# Patient Record
Sex: Male | Born: 1985
Health system: Southern US, Community
[De-identification: ages and names within clinical notes are randomized; demographics above are authoritative.]

## PROBLEM LIST (undated history)

## (undated) ENCOUNTER — Emergency Department (HOSPITAL_COMMUNITY): Admission: EM | Payer: BLUE CROSS/BLUE SHIELD | Source: Home / Self Care

## (undated) DIAGNOSIS — I10 Essential (primary) hypertension: Secondary | ICD-10-CM

## (undated) DIAGNOSIS — A150 Tuberculosis of lung: Secondary | ICD-10-CM

## (undated) HISTORY — DX: Essential (primary) hypertension: I10

## (undated) HISTORY — DX: Tuberculosis of lung: A15.0

## (undated) HISTORY — PX: OTHER SURGICAL HISTORY: SHX169

## (undated) HISTORY — PX: KNEE SURGERY: SHX244

---
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
## 2009-03-19 ENCOUNTER — Emergency Department (HOSPITAL_COMMUNITY): Admission: EM | Admit: 2009-03-19 | Discharge: 2009-03-19 | Payer: Self-pay | Admitting: Emergency Medicine

## 2010-05-02 ENCOUNTER — Encounter: Payer: Self-pay | Admitting: Orthopaedic Surgery

## 2017-11-22 DIAGNOSIS — Z7251 High risk heterosexual behavior: Secondary | ICD-10-CM | POA: Diagnosis not present

## 2017-11-22 DIAGNOSIS — Z7721 Contact with and (suspected) exposure to potentially hazardous body fluids: Secondary | ICD-10-CM | POA: Diagnosis not present

## 2018-06-22 ENCOUNTER — Other Ambulatory Visit: Payer: Self-pay

## 2018-06-22 ENCOUNTER — Encounter: Payer: Self-pay | Admitting: Family Medicine

## 2018-06-22 ENCOUNTER — Ambulatory Visit (INDEPENDENT_AMBULATORY_CARE_PROVIDER_SITE_OTHER): Payer: BLUE CROSS/BLUE SHIELD | Admitting: Family Medicine

## 2018-06-22 VITALS — BP 150/84 | HR 91 | Temp 98.0°F | Resp 14 | Wt 251.0 lb

## 2018-06-22 DIAGNOSIS — Z0000 Encounter for general adult medical examination without abnormal findings: Secondary | ICD-10-CM

## 2018-06-22 DIAGNOSIS — K4090 Unilateral inguinal hernia, without obstruction or gangrene, not specified as recurrent: Secondary | ICD-10-CM | POA: Diagnosis not present

## 2018-06-22 DIAGNOSIS — R634 Abnormal weight loss: Secondary | ICD-10-CM | POA: Diagnosis not present

## 2018-06-22 DIAGNOSIS — Z0001 Encounter for general adult medical examination with abnormal findings: Secondary | ICD-10-CM | POA: Diagnosis not present

## 2018-06-22 NOTE — Progress Notes (Signed)
Subjective:    Patient ID: Ernest Roberts, male    DOB: 1985-06-16, 33 y.o.   MRN: [id number]  HPI  Patient is a very pleasant 33 year old Caucasian male here today to establish care.  Patient has no significant past medical history that he is aware of.  He does have a very large direct inguinal hernia on the left-hand side.  He states that it has been there for quite some time.  He works in Editor, commissioning.  It does cause some occasional problems with lifting.  However he is also active in singing.  He sings on the side.  He is unable to sing due to the strain in the Valsalva.  This causes the hernia to enlarge and causes some discomfort.  Therefore he would like to meet with a surgeon to discuss surgical repair.  He also smokes 1 pack of cigarettes a day.  His blood pressure here today is elevated at 150/84.  He has never been told that he has high blood pressure.  He denies any chest pain shortness of breath or dyspnea on exertion.  He is due for a tetanus shot.  He is also due for a flu shot.  He politely declines these.  Family history is significant for depression in his mother.  His father has a history of a pulmonary embolism after an accident.  He also has a questionable history of CLL.  Otherwise medical history is noncontributory.  Patient denies any surgeries. No past medical history on file. No current outpatient medications on file prior to visit.   No current facility-administered medications on file prior to visit.    Allergies not on file Social History   Socioeconomic History  . Marital status: Single    Spouse name: Not on file  . Number of children: Not on file  . Years of education: Not on file  . Highest education level: Not on file  Occupational History  . Not on file  Social Needs  . Financial resource strain: Not on file  . Food insecurity:    Worry: Not on file    Inability: Not on file  . Transportation needs:    Medical: Not on  file    Non-medical: Not on file  Tobacco Use  . Smoking status: Current Every Day Smoker  . Smokeless tobacco: Never Used  Substance and Sexual Activity  . Alcohol use: Not Currently  . Drug use: Never  . Sexual activity: Not on file  Lifestyle  . Physical activity:    Days per week: Not on file    Minutes per session: Not on file  . Stress: Not on file  Relationships  . Social connections:    Talks on phone: Not on file    Gets together: Not on file    Attends religious service: Not on file    Active member of club or organization: Not on file    Attends meetings of clubs or organizations: Not on file    Relationship status: Not on file  . Intimate partner violence:    Fear of current or ex partner: Not on file    Emotionally abused: Not on file    Physically abused: Not on file    Forced sexual activity: Not on file  Other Topics Concern  . Not on file  Social History Narrative  . Not on file   No family history on file.   Review of Systems  All other systems  reviewed and are negative.      Objective:   Physical Exam Constitutional:      General: He is not in acute distress.    Appearance: He is obese. He is not ill-appearing, toxic-appearing or diaphoretic.  HENT:     Head: Normocephalic and atraumatic.     Right Ear: Tympanic membrane and ear canal normal. There is no impacted cerumen.     Left Ear: Tympanic membrane and ear canal normal. There is no impacted cerumen.     Nose: Nose normal. No congestion or rhinorrhea.     Mouth/Throat:     Pharynx: No oropharyngeal exudate or posterior oropharyngeal erythema.  Eyes:     General: No scleral icterus.       Right eye: No discharge.        Left eye: No discharge.     Extraocular Movements: Extraocular movements intact.     Conjunctiva/sclera: Conjunctivae normal.     Pupils: Pupils are equal, round, and reactive to light.  Neck:     Musculoskeletal: Normal range of motion and neck supple. No neck  rigidity.     Vascular: No carotid bruit.  Cardiovascular:     Rate and Rhythm: Normal rate and regular rhythm.     Pulses: Normal pulses.     Heart sounds: Normal heart sounds. No murmur. No friction rub. No gallop.   Pulmonary:     Effort: Pulmonary effort is normal. No respiratory distress.     Breath sounds: Normal breath sounds. No stridor. No wheezing, rhonchi or rales.  Chest:     Chest wall: No tenderness.  Abdominal:     General: Bowel sounds are normal. There is no distension.     Palpations: Abdomen is soft.     Tenderness: There is no abdominal tenderness. There is no guarding or rebound.     Hernia: A hernia is present. Hernia is present in the left inguinal area.  Musculoskeletal:     Right lower leg: No edema.  Lymphadenopathy:     Cervical: No cervical adenopathy.  Skin:    General: Skin is warm.     Coloration: Skin is not jaundiced or pale.     Findings: No bruising, erythema, lesion or rash.  Neurological:     General: No focal deficit present.     Mental Status: He is alert and oriented to person, place, and time. Mental status is at baseline.     Cranial Nerves: No cranial nerve deficit.     Sensory: No sensory deficit.     Motor: No weakness.     Coordination: Coordination normal.     Gait: Gait normal.     Deep Tendon Reflexes: Reflexes normal.           Assessment & Plan:  Weight loss - Plan: Hemoglobin A1c, CBC with Differential/Platelet, COMPLETE METABOLIC PANEL WITH GFR, Lipid panel, TSH  Inguinal hernia without obstruction or gangrene, recurrence not specified, unspecified laterality - Plan: Ambulatory referral to General Surgery  General medical exam  Patient states that he is lost more than 50 pounds.  He has been eating different and attempting to lose weight.  However he would like to be checked for diabetes.  He also reports occasional palpitations and tachycardia usually first thing in the morning after drinking coffee with aspartame in  it.  I believe this might be hypoglycemia or perhaps due to caffeine however given his significant weight loss, I do believe the patient would benefit from thyroid  testing.  I will consult general surgery regarding his inguinal hernia.  Meanwhile obtain a CBC, CMP, fasting lipid panel.  Patient's blood pressure here today is elevated.  Of asked him to come by again this week and allow [place] to recheck his blood pressure.  If persistently elevated, we will need to start medication to address this.  Recommended flu shot a tetanus shot with the patient politely declined

## 2018-06-23 ENCOUNTER — Other Ambulatory Visit: Payer: Self-pay | Admitting: Family Medicine

## 2018-06-23 DIAGNOSIS — E875 Hyperkalemia: Secondary | ICD-10-CM

## 2018-06-23 LAB — LIPID PANEL
CHOL/HDL RATIO: 3.2 (calc) (ref <5.0)
CHOLESTEROL: 184 mg/dL (ref <200)
HDL: 57 mg/dL (ref >=40)
LDL CHOLESTEROL (CALC): 113 mg/dL — AB
NON-HDL CHOLESTEROL (CALC): 127 mg/dL (ref <130)
Triglycerides: 49 mg/dL (ref <150)

## 2018-06-23 LAB — COMPLETE METABOLIC PANEL WITH GFR
AG Ratio: 1.8 (calc) (ref 1.0–2.5)
ALKALINE PHOSPHATASE (APISO): 68 U/L (ref 36–130)
ALT: 19 U/L (ref 9–46)
AST: 23 U/L (ref 10–40)
Albumin: 4.9 g/dL (ref 3.6–5.1)
BUN: 23 mg/dL (ref 7–25)
CO2: 25 mmol/L (ref 20–32)
CREATININE: 1.01 mg/dL (ref 0.60–1.35)
Calcium: 10.2 mg/dL (ref 8.6–10.3)
Chloride: 106 mmol/L (ref 98–110)
GFR, EST AFRICAN AMERICAN: 114 mL/min/{1.73_m2} (ref >=60)
GFR, EST NON AFRICAN AMERICAN: 99 mL/min/{1.73_m2} (ref >=60)
GLOBULIN: 2.8 g/dL (ref 1.9–3.7)
Glucose, Bld: 93 mg/dL (ref 65–99)
Potassium: 6 mmol/L — ABNORMAL HIGH (ref 3.5–5.3)
SODIUM: 141 mmol/L (ref 135–146)
TOTAL PROTEIN: 7.7 g/dL (ref 6.1–8.1)
Total Bilirubin: 0.5 mg/dL (ref 0.2–1.2)

## 2018-06-23 LAB — CBC WITH DIFFERENTIAL/PLATELET
ABSOLUTE MONOCYTES: 609 {cells}/uL (ref 200–950)
Basophils Absolute: 28 cells/uL (ref 0–200)
Basophils Relative: 0.4 %
EOS PCT: 1.9 %
Eosinophils Absolute: 133 cells/uL (ref 15–500)
HCT: 51.7 % — ABNORMAL HIGH (ref 38.5–50.0)
Hemoglobin: 17.3 g/dL — ABNORMAL HIGH (ref 13.2–17.1)
Lymphs Abs: 1792 cells/uL (ref 850–3900)
MCH: 28.4 pg (ref 27.0–33.0)
MCHC: 33.5 g/dL (ref 32.0–36.0)
MCV: 84.8 fL (ref 80.0–100.0)
MPV: 11.7 fL (ref 7.5–12.5)
Monocytes Relative: 8.7 %
Neutro Abs: 4438 cells/uL (ref 1500–7800)
Neutrophils Relative %: 63.4 %
PLATELETS: 325 10*3/uL (ref 140–400)
RBC: 6.1 10*6/uL — AB (ref 4.20–5.80)
RDW: 12.8 % (ref 11.0–15.0)
TOTAL LYMPHOCYTE: 25.6 %
WBC: 7 10*3/uL (ref 3.8–10.8)

## 2018-06-23 LAB — HEMOGLOBIN A1C
HEMOGLOBIN A1C: 5.2 %{Hb} (ref <5.7)
Mean Plasma Glucose: 103 (calc)
eAG (mmol/L): 5.7 (calc)

## 2018-06-23 LAB — TSH: TSH: 1.58 mIU/L (ref 0.40–4.50)

## 2018-07-14 ENCOUNTER — Emergency Department (HOSPITAL_COMMUNITY)
Admission: EM | Admit: 2018-07-14 | Discharge: 2018-07-14 | Disposition: A | Discharge status: Home / Self Care | Payer: BLUE CROSS/BLUE SHIELD | Attending: Emergency Medicine | Admitting: Emergency Medicine

## 2018-07-14 ENCOUNTER — Encounter (HOSPITAL_COMMUNITY): Payer: Self-pay | Admitting: Emergency Medicine

## 2018-07-14 ENCOUNTER — Emergency Department (HOSPITAL_COMMUNITY): Payer: BLUE CROSS/BLUE SHIELD

## 2018-07-14 DIAGNOSIS — F17210 Nicotine dependence, cigarettes, uncomplicated: Secondary | ICD-10-CM | POA: Diagnosis not present

## 2018-07-14 DIAGNOSIS — R42 Dizziness and giddiness: Secondary | ICD-10-CM | POA: Insufficient documentation

## 2018-07-14 LAB — BASIC METABOLIC PANEL
Anion gap: 10 (ref 5–15)
BUN: 18 mg/dL (ref 6–20)
CO2: 21 mmol/L — ABNORMAL LOW (ref 22–32)
Calcium: 9.3 mg/dL (ref 8.9–10.3)
Chloride: 105 mmol/L (ref 98–111)
Creatinine, Ser: 0.93 mg/dL (ref 0.61–1.24)
GFR calc Af Amer: >60 mL/min (ref >60)
GFR calc non Af Amer: >60 mL/min (ref >60)
Glucose, Bld: 99 mg/dL (ref 70–99)
Potassium: 3.9 mmol/L (ref 3.5–5.1)
Sodium: 136 mmol/L (ref 135–145)

## 2018-07-14 LAB — CBC WITH DIFFERENTIAL/PLATELET
Abs Immature Granulocytes: 0.04 10*3/uL (ref 0.00–0.07)
Basophils Absolute: 0 10*3/uL (ref 0.0–0.1)
Basophils Relative: 0 %
Eosinophils Absolute: 0.1 10*3/uL (ref 0.0–0.5)
Eosinophils Relative: 1 %
HCT: 49.5 % (ref 39.0–52.0)
Hemoglobin: 16.1 g/dL (ref 13.0–17.0)
Immature Granulocytes: 0 %
Lymphocytes Relative: 16 %
Lymphs Abs: 2.1 10*3/uL (ref 0.7–4.0)
MCH: 27.7 pg (ref 26.0–34.0)
MCHC: 32.5 g/dL (ref 30.0–36.0)
MCV: 85.1 fL (ref 80.0–100.0)
Monocytes Absolute: 1 10*3/uL (ref 0.1–1.0)
Monocytes Relative: 8 %
Neutro Abs: 9.7 10*3/uL — ABNORMAL HIGH (ref 1.7–7.7)
Neutrophils Relative %: 75 %
Platelets: 318 10*3/uL (ref 150–400)
RBC: 5.82 MIL/uL — ABNORMAL HIGH (ref 4.22–5.81)
RDW: 12.7 % (ref 11.5–15.5)
WBC: 12.9 10*3/uL — ABNORMAL HIGH (ref 4.0–10.5)
nRBC: 0 % (ref 0.0–0.2)

## 2018-07-14 MED ORDER — GADOBUTROL 1 MMOL/ML IV SOLN
10.0000 mL | Freq: Once | INTRAVENOUS | Status: AC | PRN
  Administered 2018-07-14: 10 mL via INTRAVENOUS

## 2018-07-14 NOTE — ED Notes (Signed)
ED Provider at bedside. 

## 2018-07-14 NOTE — ED Notes (Signed)
Patient transported to MRI 

## 2018-07-14 NOTE — Discharge Instructions (Signed)
Your potassium today was normal.  Follow up with your family doc and neurology for further workup.

## 2018-07-14 NOTE — ED Triage Notes (Signed)
Pt states for the last "few months". He has had occasional dizziness he a "cold feeling in both his lower legs". Pt denies any medical problems but has HtN at triage.

## 2018-07-14 NOTE — ED Provider Notes (Signed)
[facility] EMERGENCY DEPARTMENT Provider Note   CSN: [id number] Arrival date & time: 07/14/18  1848    History   Chief Complaint Chief Complaint  Patient presents with   Dizziness    HPI Ernest Roberts is a 33 y.o. male.     33 yo M with a chief complaint of dizziness.  He describes this as a foggy feeling in his head.  Going on for about 2-3 months.  Having a couple episodes a day. Had an episode when he was bending over and stood up but does not happen always that way.  Usually happens spontaneously.  States that he then wills himself out of it.  Usually afterwards he spontaneously has to yawn and then stretch.  He had an episode today where he felt dizzy and then felt numbness and coldness to bilateral lower extremities from the knees down.  This lasted a few seconds and then resolved.  He now feels much better.  He denies headaches or neck pain.  Denies chest pain or shortness of breath.  Denies vomiting or diarrhea.  Initially was thought to be secondary to his significant weight gain as he is lost more than 50 pounds.  And there is also concerned that maybe it was related to caffeine or aspartame though he has not been using aspartame anymore and has cut down on caffeine.  The history is provided by the patient.  Dizziness  Associated symptoms: no chest pain, no diarrhea, no headaches, no palpitations, no shortness of breath and no vomiting   Illness  Severity:  Moderate Onset quality:  Gradual Duration:  2 months Timing:  Constant Progression:  Worsening Chronicity:  New Associated symptoms: no abdominal pain, no chest pain, no congestion, no diarrhea, no fever, no headaches, no myalgias, no rash, no shortness of breath and no vomiting     History reviewed. No pertinent past medical history.  There are no active problems to display for this patient.   History reviewed. No pertinent surgical history.      Home Medications    Prior to  Admission medications   Not on File    Family History No family history on file.  Social History Social History   Tobacco Use   Smoking status: Current Every Day Smoker   Smokeless tobacco: Never Used  Substance Use Topics   Alcohol use: Not Currently   Drug use: Never     Allergies   Patient has no known allergies.   Review of Systems Review of Systems  Constitutional: Negative for chills and fever.  HENT: Negative for congestion and facial swelling.   Eyes: Negative for discharge and visual disturbance.  Respiratory: Negative for shortness of breath.   Cardiovascular: Negative for chest pain and palpitations.  Gastrointestinal: Negative for abdominal pain, diarrhea and vomiting.  Musculoskeletal: Negative for arthralgias and myalgias.  Skin: Negative for color change and rash.  Neurological: Positive for dizziness. Negative for tremors, syncope and headaches.  Psychiatric/Behavioral: Negative for confusion and dysphoric mood.     Physical Exam Updated Vital Signs BP (!) 135/92 (BP Location: Right Arm)    Pulse 72    Temp 98.3 F (36.8 C) (Oral)    Resp 16    SpO2 100%   Physical Exam Vitals signs and nursing note reviewed.  Constitutional:      Appearance: He is well-developed.  HENT:     Head: Normocephalic and atraumatic.  Eyes:     Pupils: Pupils are equal, round,  and reactive to light.  Neck:     Musculoskeletal: Normal range of motion and neck supple.     Vascular: No JVD.  Cardiovascular:     Rate and Rhythm: Normal rate and regular rhythm.     Heart sounds: No murmur. No friction rub. No gallop.   Pulmonary:     Effort: No respiratory distress.     Breath sounds: No wheezing.  Abdominal:     General: There is no distension.     Tenderness: There is no guarding or rebound.  Musculoskeletal: Normal range of motion.  Skin:    Coloration: Skin is not pale.     Findings: No rash.  Neurological:     Mental Status: He is alert and oriented to  person, place, and time.     GCS: GCS eye subscore is 4. GCS verbal subscore is 5. GCS motor subscore is 6.     Cranial Nerves: Cranial nerves are intact.     Sensory: Sensation is intact.     Motor: Motor function is intact.     Coordination: Coordination is intact. Romberg sign negative. Finger-Nose-Finger Test normal. Rapid alternating movements normal.     Gait: Gait is intact.     Comments: Benign neurologic exam ambulates without difficulty.  Psychiatric:        Behavior: Behavior normal.      ED Treatments / Results  Labs (all labs ordered are listed, but only abnormal results are displayed) Labs Reviewed  CBC WITH DIFFERENTIAL/PLATELET - Abnormal; Notable for the following components:      Result Value   WBC 12.9 (*)    RBC 5.82 (*)    Neutro Abs 9.7 (*)    All other components within normal limits  BASIC METABOLIC PANEL - Abnormal; Notable for the following components:   CO2 21 (*)    All other components within normal limits    EKG EKG Interpretation  Date/Time:  Friday July 14 2018 19:50:14 EDT Ventricular Rate:  88 PR Interval:    QRS Duration: 108 QT Interval:  365 QTC Calculation: 442 R Axis:   81 Text Interpretation:  Sinus rhythm No old tracing to compare Confirmed by Melene Plan [phone]) on 07/14/2018 8:00:40 PM   Radiology Mr Maxine Glenn Head Wo Contrast  Result Date: 07/14/2018 CLINICAL DATA:  Dizziness with lower extremity paresthesia EXAM: MR HEAD WITHOUT CONTRAST MR CIRCLE OF WILLIS WITHOUT CONTRAST MRA OF THE NECK WITHOUT AND WITH CONTRAST TECHNIQUE: Multiplanar, multiecho pulse sequences of the brain, circle of willis and surrounding structures were obtained without intravenous contrast. Angiographic images of the neck were obtained using MRA technique without and with intravenous contrast. CONTRAST:  10 mL Gadavist COMPARISON:  None. FINDINGS: MRI HEAD FINDINGS BRAIN: The midline structures are normal. There is no acute infarct, acute hemorrhage or mass. The  white matter signal is normal for the patient's age. The CSF spaces are normal for age, with no hydrocephalus. Blood-sensitive sequences show no chronic microhemorrhage or superficial siderosis. SKULL AND UPPER CERVICAL SPINE: The visualized skull base, calvarium, upper cervical spine and extracranial soft tissues are normal. SINUSES/ORBITS: No fluid levels or advanced mucosal thickening. No mastoid or middle ear effusion. The orbits are normal. MRA HEAD FINDINGS POSTERIOR CIRCULATION: --Basilar artery: Normal. --Posterior cerebral arteries: Normal. Both originate from the basilar artery. --Superior cerebellar arteries: Normal. --Inferior cerebellar arteries: Normal anterior and posterior inferior cerebellar arteries. ANTERIOR CIRCULATION: --Intracranial internal carotid arteries: Normal. --Anterior cerebral arteries: Normal. Both A1 segments are present. Patent anterior  communicating artery. --Middle cerebral arteries: Normal. --Posterior communicating arteries: Fetal origin of the right posterior cerebral artery. MRA NECK FINDINGS Aortic arch: Normal 3 vessel aortic branching pattern. The visualized subclavian arteries are normal. Right carotid system: Normal course and caliber without stenosis or evidence of dissection. Left carotid system: Normal course and caliber without stenosis or evidence of dissection. Vertebral arteries: Left dominant. Vertebral artery origins are normal. Vertebral arteries are normal in course and caliber to the vertebrobasilar confluence without stenosis or evidence of dissection. There is a short segment of the right vertebral artery that is out of the field-of-view. IMPRESSION: 1. Normal MRI/MRA of the brain. 2. Normal MRA of the neck. Electronically Signed   By: Deatra Robinson M.D.   On: 07/14/2018 23:28   Mr Angiogram Neck W Or Wo Contrast  Result Date: 07/14/2018 CLINICAL DATA:  Dizziness with lower extremity paresthesia EXAM: MR HEAD WITHOUT CONTRAST MR CIRCLE OF WILLIS WITHOUT  CONTRAST MRA OF THE NECK WITHOUT AND WITH CONTRAST TECHNIQUE: Multiplanar, multiecho pulse sequences of the brain, circle of willis and surrounding structures were obtained without intravenous contrast. Angiographic images of the neck were obtained using MRA technique without and with intravenous contrast. CONTRAST:  10 mL Gadavist COMPARISON:  None. FINDINGS: MRI HEAD FINDINGS BRAIN: The midline structures are normal. There is no acute infarct, acute hemorrhage or mass. The white matter signal is normal for the patient's age. The CSF spaces are normal for age, with no hydrocephalus. Blood-sensitive sequences show no chronic microhemorrhage or superficial siderosis. SKULL AND UPPER CERVICAL SPINE: The visualized skull base, calvarium, upper cervical spine and extracranial soft tissues are normal. SINUSES/ORBITS: No fluid levels or advanced mucosal thickening. No mastoid or middle ear effusion. The orbits are normal. MRA HEAD FINDINGS POSTERIOR CIRCULATION: --Basilar artery: Normal. --Posterior cerebral arteries: Normal. Both originate from the basilar artery. --Superior cerebellar arteries: Normal. --Inferior cerebellar arteries: Normal anterior and posterior inferior cerebellar arteries. ANTERIOR CIRCULATION: --Intracranial internal carotid arteries: Normal. --Anterior cerebral arteries: Normal. Both A1 segments are present. Patent anterior communicating artery. --Middle cerebral arteries: Normal. --Posterior communicating arteries: Fetal origin of the right posterior cerebral artery. MRA NECK FINDINGS Aortic arch: Normal 3 vessel aortic branching pattern. The visualized subclavian arteries are normal. Right carotid system: Normal course and caliber without stenosis or evidence of dissection. Left carotid system: Normal course and caliber without stenosis or evidence of dissection. Vertebral arteries: Left dominant. Vertebral artery origins are normal. Vertebral arteries are normal in course and caliber to the  vertebrobasilar confluence without stenosis or evidence of dissection. There is a short segment of the right vertebral artery that is out of the field-of-view. IMPRESSION: 1. Normal MRI/MRA of the brain. 2. Normal MRA of the neck. Electronically Signed   By: Deatra Robinson M.D.   On: 07/14/2018 23:28   Mr Brain Wo Contrast  Result Date: 07/14/2018 CLINICAL DATA:  Dizziness with lower extremity paresthesia EXAM: MR HEAD WITHOUT CONTRAST MR CIRCLE OF WILLIS WITHOUT CONTRAST MRA OF THE NECK WITHOUT AND WITH CONTRAST TECHNIQUE: Multiplanar, multiecho pulse sequences of the brain, circle of willis and surrounding structures were obtained without intravenous contrast. Angiographic images of the neck were obtained using MRA technique without and with intravenous contrast. CONTRAST:  10 mL Gadavist COMPARISON:  None. FINDINGS: MRI HEAD FINDINGS BRAIN: The midline structures are normal. There is no acute infarct, acute hemorrhage or mass. The white matter signal is normal for the patient's age. The CSF spaces are normal for age, with no hydrocephalus. Blood-sensitive sequences show  no chronic microhemorrhage or superficial siderosis. SKULL AND UPPER CERVICAL SPINE: The visualized skull base, calvarium, upper cervical spine and extracranial soft tissues are normal. SINUSES/ORBITS: No fluid levels or advanced mucosal thickening. No mastoid or middle ear effusion. The orbits are normal. MRA HEAD FINDINGS POSTERIOR CIRCULATION: --Basilar artery: Normal. --Posterior cerebral arteries: Normal. Both originate from the basilar artery. --Superior cerebellar arteries: Normal. --Inferior cerebellar arteries: Normal anterior and posterior inferior cerebellar arteries. ANTERIOR CIRCULATION: --Intracranial internal carotid arteries: Normal. --Anterior cerebral arteries: Normal. Both A1 segments are present. Patent anterior communicating artery. --Middle cerebral arteries: Normal. --Posterior communicating arteries: Fetal origin of the  right posterior cerebral artery. MRA NECK FINDINGS Aortic arch: Normal 3 vessel aortic branching pattern. The visualized subclavian arteries are normal. Right carotid system: Normal course and caliber without stenosis or evidence of dissection. Left carotid system: Normal course and caliber without stenosis or evidence of dissection. Vertebral arteries: Left dominant. Vertebral artery origins are normal. Vertebral arteries are normal in course and caliber to the vertebrobasilar confluence without stenosis or evidence of dissection. There is a short segment of the right vertebral artery that is out of the field-of-view. IMPRESSION: 1. Normal MRI/MRA of the brain. 2. Normal MRA of the neck. Electronically Signed   By: Deatra Robinson M.D.   On: 07/14/2018 23:28    Procedures Procedures (including critical care time)  Medications Ordered in ED Medications  gadobutrol (GADAVIST) 1 MMOL/ML injection 10 mL (10 mLs Intravenous Contrast Given 07/14/18 2254)     Initial Impression / Assessment and Plan / ED Course  I have reviewed the triage vital signs and the nursing notes.  Pertinent labs & imaging results that were available during my care of the patient were reviewed by me and considered in my medical decision making (see chart for details).        33 yo M with a chief complaint of dizziness.  Described as a fuzzy headed feeling.  Going on for the past 3 months after he had a significant weight loss.  An episode today where he felt that both legs were cold and numb from the knee down.  I discussed the case with Dr. Amada Jupiter, neurology he felt this was unlikely to be an intracranial pathology but if it was he was concerned for a tumor that may be pushing on the thalamic tracts.  He thought this was something that we should not miss and recommended an MRI MRA of the head and neck.  MR negative, d/c home.   3:12 PM:  I have discussed the diagnosis/risks/treatment options with the patient and believe  the pt to be eligible for discharge home to follow-up with PCP, neuro. We also discussed returning to the ED immediately if new or worsening sx occur. We discussed the sx which are most concerning (e.g., sudden worsening pain, fever, inability to tolerate by mouth) that necessitate immediate return. Medications administered to the patient during their visit and any new prescriptions provided to the patient are listed below.  Medications given during this visit Medications  gadobutrol (GADAVIST) 1 MMOL/ML injection 10 mL (10 mLs Intravenous Contrast Given 07/14/18 2254)     The patient appears reasonably screen and/or stabilized for discharge and I doubt any other medical condition or other [facility] requiring further screening, evaluation, or treatment in the ED at this time prior to discharge.    Final Clinical Impressions(s) / ED Diagnoses   Final diagnoses:  Lightheadedness    ED Discharge Orders  Ordered    Ambulatory referral to Neurology    Comments:  Episodic lightheadedness   07/14/18 2331           Melene Plan, DO 07/15/18 1512

## 2018-07-17 ENCOUNTER — Telehealth: Payer: Self-pay | Admitting: Family Medicine

## 2018-07-17 NOTE — Telephone Encounter (Signed)
Patient calling to talk to you regarding his blood pressure and going to the er  Would like to talk to you regarding a referral  [phone]

## 2018-07-18 ENCOUNTER — Ambulatory Visit (INDEPENDENT_AMBULATORY_CARE_PROVIDER_SITE_OTHER): Payer: BLUE CROSS/BLUE SHIELD | Admitting: Family Medicine

## 2018-07-18 ENCOUNTER — Other Ambulatory Visit: Payer: Self-pay

## 2018-07-18 DIAGNOSIS — R55 Syncope and collapse: Secondary | ICD-10-CM | POA: Diagnosis not present

## 2018-07-18 DIAGNOSIS — Z09 Encounter for follow-up examination after completed treatment for conditions other than malignant neoplasm: Secondary | ICD-10-CM

## 2018-07-18 DIAGNOSIS — R634 Abnormal weight loss: Secondary | ICD-10-CM

## 2018-07-18 MED ORDER — AMLODIPINE BESYLATE 5 MG PO TABS: 5.0000 mg | ORAL_TABLET | Freq: Every day | ORAL | 3 refills | Status: AC

## 2018-07-18 NOTE — Telephone Encounter (Signed)
Pt's mother aware via mychart through pt's father jerry Mccormac

## 2018-07-18 NOTE — Progress Notes (Signed)
Subjective:    Patient ID: Ernest Roberts, male    DOB: Mar 28, 1986, 33 y.o.   MRN: [id number]  HPI 06/22/18 Patient is a very pleasant 33 year old Caucasian male here today to establish care.  Patient has no significant past medical history that he is aware of.  He does have a very large direct inguinal hernia on the left-hand side.  He states that it has been there for quite some time.  He works in [employer], commissioning.  It does cause some occasional problems with lifting.  However he is also active in singing.  He sings on the side.  He is unable to sing due to the strain in the Valsalva.  This causes the hernia to enlarge and causes some discomfort.  Therefore he would like to meet with a surgeon to discuss surgical repair.  He also smokes 1 pack of cigarettes a day.  His blood pressure here today is elevated at 150/84.  He has never been told that he has high blood pressure.  He denies any chest pain shortness of breath or dyspnea on exertion.  He is due for a tetanus shot.  He is also due for a flu shot.  He politely declines these.  Family history is significant for depression in his mother.  His father has a history of a pulmonary embolism after an accident.  He also has a questionable history of CLL.  Otherwise medical history is noncontributory.  Patient denies any surgeries.  At that time, my plan was: Patient states that he is lost more than 50 pounds.  He has been eating different and attempting to lose weight.  However he would like to be checked for diabetes.  He also reports occasional palpitations and tachycardia usually first thing in the morning after drinking coffee with aspartame in it.  I believe this might be hypoglycemia or perhaps due to caffeine however given his significant weight loss, I do believe the patient would benefit from thyroid testing.  I will consult general surgery regarding his inguinal hernia.  Meanwhile obtain a CBC, CMP, fasting lipid panel.   Patient's blood pressure here today is elevated.  Of asked him to come by again this week and allow [place] to recheck his blood pressure.  If persistently elevated, we will need to start medication to address this.  Recommended flu shot a tetanus shot with the patient politely declined  07/18/18 Patient is being seen today for hospital follow-up over the telephone.  He consents to a phone visit.  He is currently at home.  I am currently my office.  Phone call began at 154.  Phone call concluded at 220 Labs in March were significant for a potassium of 6.  Otherwise they were normal.  Patient was recently seen at the hospital.  Patient states that since I last saw him, he will have attacks.  These attacks occur usually twice a day.  The patient describes these attacks as that overwhelming feeling that he needs to go to sleep or that he is going to pass out.  If he does not quickly "jump up and move", he feels that he is going to go to sleep or pass out.  The symptoms will gradually come on and steadily worsen.  They usually gradually improve and go away after 30 minutes to an hour.  There seem to be no exacerbating factors.  Movement tends to help alleviate some of the symptoms.  He certainly denies any orthostatic  dizziness.  He denies any vertigo.  He denies any hearing loss or tinnitus.  He denies any seizure-like activity.  He denies any numbness or weakness or tingling in his arms or in his legs.  He denies any double vision or blurry vision.  He does report metallic dyschezia that occurs usually at the exact time that the symptoms arrive and gradually improves as the symptoms abate.  He denies any actual loss of consciousness.  He denies any palpitations today or tachycardia or chest pain or shortness of breath or pleurisy. No past medical history on file. No current outpatient medications on file prior to visit.   No current facility-administered medications on file prior to visit.    No Known  Allergies Social History   Socioeconomic History   Marital status: Single    Spouse name: Not on file   Number of children: Not on file   Years of education: Not on file   Highest education level: Not on file  Occupational History   Not on file  Social Needs   Financial resource strain: Not on file   Food insecurity:    Worry: Not on file    Inability: Not on file   Transportation needs:    Medical: Not on file    Non-medical: Not on file  Tobacco Use   Smoking status: Current Every Day Smoker   Smokeless tobacco: Never Used  Substance and Sexual Activity   Alcohol use: Not Currently   Drug use: Never   Sexual activity: Not on file  Lifestyle   Physical activity:    Days per week: Not on file    Minutes per session: Not on file   Stress: Not on file  Relationships   Social connections:    Talks on phone: Not on file    Gets together: Not on file    Attends religious service: Not on file    Active member of club or organization: Not on file    Attends meetings of clubs or organizations: Not on file    Relationship status: Not on file   Intimate partner violence:    Fear of current or ex partner: Not on file    Emotionally abused: Not on file    Physically abused: Not on file    Forced sexual activity: Not on file  Other Topics Concern   Not on file  Social History Narrative   Not on file   No family history on file.   Review of Systems  All other systems reviewed and are negative.      Objective:  No physical exam was performed as the patient was being seen over the telephone       Assessment & Plan:  Near syncope - Plan: Holter monitor - 24 hour  Weight loss  Differential diagnosis for near syncope would be cardiac arrhythmias, seizure disorder, panic attacks.  However some of his symptoms are concerning for possible narcolepsy.  I will schedule the patient for Holter monitor to rule out cardiac arrhythmias.  He has an appointment  tomorrow to discuss the situation with a neurologist and I will defer to their expertise if they feel an EEG is necessary.  I do believe the patient would benefit from a modified sleep study to evaluate for possible narcolepsy given the nature of the attacks and his symptomatology.  Also on the differential diagnosis is panic attacks given the odd nature of his symptoms and the way he describes them.  Await  the results of his neurology consultation.  Meanwhile treat his blood pressure with amlodipine 5 mg a day.  If the Holter monitor is normal and his neurology evaluation is normal, I would consider treatment for panic attacks.  However patient does not feel that he is anxious.  He states that over the last 3 weeks he has been extremely anxious but he states that the symptoms began before he started feeling anxious and therefore he believes that there is a physical cause of his symptoms and is not purely anxiety.

## 2018-07-19 ENCOUNTER — Encounter: Payer: Self-pay | Admitting: *Deleted

## 2018-07-19 ENCOUNTER — Telehealth: Payer: Self-pay | Admitting: Neurology

## 2018-07-19 NOTE — Telephone Encounter (Signed)
Called patient back and updated his chart.

## 2018-07-19 NOTE — Telephone Encounter (Signed)
Patient returning a call to the office. He has a Multimedia programmer appointment tomorrow 07/20/2018. Thanks

## 2018-07-20 ENCOUNTER — Telehealth (INDEPENDENT_AMBULATORY_CARE_PROVIDER_SITE_OTHER): Payer: BLUE CROSS/BLUE SHIELD | Admitting: Neurology

## 2018-07-20 ENCOUNTER — Encounter: Payer: Self-pay | Admitting: *Deleted

## 2018-07-20 ENCOUNTER — Other Ambulatory Visit: Payer: Self-pay

## 2018-07-20 DIAGNOSIS — R55 Syncope and collapse: Secondary | ICD-10-CM

## 2018-07-20 DIAGNOSIS — R432 Parageusia: Secondary | ICD-10-CM

## 2018-07-20 DIAGNOSIS — R404 Transient alteration of awareness: Secondary | ICD-10-CM

## 2018-07-20 NOTE — Progress Notes (Signed)
New Patient Virtual Visit via Video Note The purpose of this virtual visit is to provide medical care while limiting exposure to the novel coronavirus.    Consent was obtained for video visit:  Yes.   Answered questions that patient had about telehealth interaction:  Yes.   I discussed the limitations, risks, security and privacy concerns of performing an evaluation and management service by telemedicine. I also discussed with the patient that there may be a patient responsible charge related to this service. The patient expressed understanding and agreed to proceed.  Pt location: Home Physician Location: office Name of referring provider:  Melene PlanFloyd, Dan, DO I connected with Ernest Roberts at patients initiation/request on 07/20/2018 at  1:00 PM EDT by video enabled telemedicine application and verified that I am speaking with the correct person using two identifiers. Pt MRN:  [id number] Pt DOB:  14-Dec-1985 Video Participants:  Ernest Roberts    History of Present Illness: Ernest Roberts is a 33 y.o. right-handed Caucasian male with tobacco use, hypertension, and anxiety presenting for evaluation of spells of lightheadedness.   Starting on March 6th, he began experiencing spells of lightheadedness. These spells are characterized as "feeling as in a daze/hazy, usually when relaxed".  He suddenly feels that he could fall asleep, then develops metallic taste, and he quickly and voluntarily shakes to get out of the spell and usually finds himself walking around.  There is no loss of consciousness, fatigue that follows or confusion.   It resolved within 2 minutes. These spells occur daily occurring up to twice per week.   These spells have not occurred when he is active.  There is no associated headache, weakness, falls.  His blood pressure has been normal during these episodes.  He has been monitoring his blood pressure multiple times per day because he thought it may be related, but has not  found a correlate.  He has recorded SBP usually in 140s and recently saw his PCP who recommended medication, if it remains elevated.  Holter testing and possible sleep study has been discussed by his PCP.  He went to the ER on 4/3 with these complaints and had MRI/A brain and MRA neck which was normal.  He smokes 1 PPD for the past 15 years.  He does not vape.  He lives with roommate.  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     Past Medical History:  Diagnosis Date  . Hypertension   . TB (pulmonary tuberculosis)     Past Surgical History:  Procedure Laterality Date  . KNEE SURGERY    . lymph node removal    . tumer removal       Medications:  Outpatient Encounter Medications as of 07/20/2018  Medication Sig  . amLODipine (NORVASC) 5 MG tablet Take 1 tablet (5 mg total) by mouth daily.   No facility-administered encounter medications on file as of 07/20/2018.     Allergies: No Known Allergies  Family History: Family History  Problem Relation Age of Onset  . Breast cancer Mother   . Leukemia Father     Social History: Social History   Tobacco Use  . Smoking status: Current Every Day Smoker  . Smokeless tobacco: Never Used  Substance Use Topics  . Alcohol use: Not Currently  . Drug use: Never   Social History   Social History Narrative   Lives with roommate in a one story home.  No children.  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  Education: high school.     Review of Systems:  CONSTITUTIONAL: No fevers, chills, night sweats, or weight loss.   EYES: No visual changes or eye pain ENT: No hearing changes.  No history of nose bleeds.   RESPIRATORY: No cough, wheezing and shortness of breath.   CARDIOVASCULAR: Negative for chest pain, and palpitations.   GI: Negative for abdominal discomfort, blood in stools or black stools.  No recent change in bowel habits.   GU:  No history of incontinence.   MUSCLOSKELETAL: No history of joint pain or swelling.  No myalgias.   SKIN:  Negative for lesions, rash, and itching.   HEMATOLOGY/ONCOLOGY: Negative for prolonged bleeding, bruising easily, and swollen nodes.  No history of cancer.   ENDOCRINE: Negative for cold or heat intolerance, polydipsia or goiter.   PSYCH:  No depression +anxiety symptoms.   NEURO: As Above.    General Medical Exam:  Well appearing, comfortable.  Nonlabored breathing.  No deformity or edema.  No rash.  Neurological Exam: MENTAL STATUS including orientation to time, place, person, recent and remote memory, attention span and concentration, language, and fund of knowledge is normal.  Speech is not dysarthric.  CRANIAL NERVES:  Normal conjugate, extra-ocular eye movements in all directions of gaze.  No ptosis.  Normal facial symmetry and movements.  Normal shoulder shrug and head rotation.  Tongue is midline.  MOTOR:  Antigravity in all extremities.  No abnormal movements.  No pronator drift.   SENSORY: Unable to assess  COORDINATION/GAIT: Normal finger to nose bilaterally.  Intact rapid alternating movements bilaterally.  Able to rise from a chair without using arms.  Gait narrow based and stable.    DATA: MRI/A brain 07/14/2018:  Normal MRA neck 07/14/2018:  Normal  Lab Results  Component Value Date   TSH 1.58 06/22/2018   Lab Results  Component Value Date   HGBA1C 5.2 06/22/2018    IMPRESSION/PLAN: Stereotyped spells of change in sensorium, dysgeusia, and fatigue. Unclear etiology, seizure remains a possibility especially given associated change in taste and therefore will order sleep-deprived EEG. MRI/A brain and MRA neck was personally viewed and agree with normal findings - specifically no structural brain disease or vascular pathology. I agree with his PCP to check for cardiac arrhyhtmia with holter testing and consideration for sleep study.  He does not have loss of awareness/consciousness and therefore may continue to drive, however, with his sleepiness, I have caution him against  drowsy driving.    Follow Up Instructions:  I discussed the assessment and treatment plan with the patient. The patient was provided an opportunity to ask questions and all were answered. The patient agreed with the plan and demonstrated an understanding of the instructions.   The patient was advised to call back or seek an in-person evaluation if the symptoms worsen or if the condition fails to improve as anticipated.    Glendale Chard, DO

## 2018-07-20 NOTE — Progress Notes (Signed)
I will put him on the waiting list.

## 2018-07-24 ENCOUNTER — Other Ambulatory Visit: Payer: Self-pay

## 2018-07-25 ENCOUNTER — Ambulatory Visit: Payer: BLUE CROSS/BLUE SHIELD | Admitting: Family Medicine

## 2018-07-25 ENCOUNTER — Other Ambulatory Visit: Payer: Self-pay

## 2018-07-25 ENCOUNTER — Encounter: Payer: Self-pay | Admitting: Family Medicine

## 2018-07-25 VITALS — BP 150/90 | HR 92 | Temp 98.4°F | Resp 18 | Ht 72.0 in | Wt 254.0 lb

## 2018-07-25 DIAGNOSIS — F410 Panic disorder [episodic paroxysmal anxiety] without agoraphobia: Secondary | ICD-10-CM

## 2018-07-25 MED ORDER — CLONAZEPAM 0.5 MG PO TABS: 0.5000 mg | ORAL_TABLET | Freq: Three times a day (TID) | ORAL | 1 refills | Status: AC | PRN

## 2018-07-25 NOTE — Progress Notes (Signed)
Subjective:    Patient ID: Ernest Roberts, male    DOB: Mar 28, 1986, 33 y.o.   MRN: [id number]  HPI 06/22/18 Patient is a very pleasant 33 year old Caucasian male here today to establish care.nt 33 year old Caucasian male here today to establish care.  Patient has no significant past medical history that he is aware of.  He does have a very large direct inguinal hernia on the left-hand side.  He states that it has been there for quite some time.  He works in [employer], commissioning.  It does cause some occasional problems with lifting.  However he is also active in singing.  He sings on the side.  He is unable to sing due to the strain in the Valsalva.  This causes the hernia to enlarge and causes some discomfort.  Therefore he would like to meet with a surgeon to discuss surgical repair.  He also smokes 1 pack of cigarettes a day.  His blood pressure here today is elevated at 150/84.  He has never been told that he has high blood pressure.  He denies any chest pain shortness of breath or dyspnea on exertion.  He is due for a tetanus shot.  He is also due for a flu shot.  He politely declines these.  Family history is significant for depression in his mother.  His father has a history of a pulmonary embolism after an accident.  He also has a questionable history of CLL.  Otherwise medical history is noncontributory.  Patient denies any surgeries.  At that time, my plan was: Patient states that he is lost more than 50 pounds.  He has been eating different and attempting to lose weight.  However he would like to be checked for diabetes.  He also reports occasional palpitations and tachycardia usually first thing in the morning after drinking coffee with aspartame in it.  I believe this might be hypoglycemia or perhaps due to caffeine however given his significant weight loss, I do believe the patient would benefit from thyroid testing.  I will consult general surgery regarding his inguinal hernia.  Meanwhile obtain a CBC, CMP, fasting lipid panel.   Patient's blood pressure here today is elevated.  Of asked him to come by again this week and allow [place] to recheck his blood pressure.  If persistently elevated, we will need to start medication to address this.  Recommended flu shot a tetanus shot with the patient politely declined  07/18/18 Patient is being seen today for hospital follow-up over the telephone.  He consents to a phone visit.  He is currently at home.  I am currently my office.  Phone call began at 154.  Phone call concluded at 220 Labs in March were significant for a potassium of 6.  Otherwise they were normal.  Patient was recently seen at the hospital.  Patient states that since I last saw him, he will have attacks.  These attacks occur usually twice a day.  The patient describes these attacks as that overwhelming feeling that he needs to go to sleep or that he is going to pass out.  If he does not quickly "jump up and move", he feels that he is going to go to sleep or pass out.  The symptoms will gradually come on and steadily worsen.  They usually gradually improve and go away after 30 minutes to an hour.  There seem to be no exacerbating factors.  Movement tends to help alleviate some of the symptoms.  He certainly denies any orthostatic  dizziness.  He denies any vertigo.  He denies any hearing loss or tinnitus.  He denies any seizure-like activity.  He denies any numbness or weakness or tingling in his arms or in his legs.  He denies any double vision or blurry vision.  He does report metallic dyschezia that occurs usually at the exact time that the symptoms arrive and gradually improves as the symptoms abate.  He denies any actual loss of consciousness.  He denies any palpitations today or tachycardia or chest pain or shortness of breath or pleurisy.  At that time, my plan was: Differential diagnosis for near syncope would be cardiac arrhythmias, seizure disorder, panic attacks.  However some of his symptoms are concerning for possible  narcolepsy.  I will schedule the patient for Holter monitor to rule out cardiac arrhythmias.  He has an appointment tomorrow to discuss the situation with a neurologist and I will defer to their expertise if they feel an EEG is necessary.  I do believe the patient would benefit from a modified sleep study to evaluate for possible narcolepsy given the nature of the attacks and his symptomatology.  Also on the differential diagnosis is panic attacks given the odd nature of his symptoms and the way he describes them.  Await the results of his neurology consultation.  Meanwhile treat his blood pressure with amlodipine 5 mg a day.  If the Holter monitor is normal and his neurology evaluation is normal, I would consider treatment for panic attacks.  However patient does not feel that he is anxious.  He states that over the last 3 weeks he has been extremely anxious but he states that the symptoms began before he started feeling anxious and therefore he believes that there is a physical cause of his symptoms and is not purely anxiety.  07/25/18 I reviewed the patient's office visit with the neurologist.  They have recommended an EEG to rule out seizures given the patient's metallic dyschezia and altered sensorium prior to his attacks.  However the patient is concerned because the attacks are becoming more and more frequent.  He denies any syncope.  However he states that he feels like he is going to fall asleep.  This will cause him to jump up panic and move suddenly in order to prevent himself from falling to sleep.  He is afraid that if he falls asleep he would die.  The attacks are occurring frequently.  On the most recent attack, he developed an unusual sensation in his left arm and left leg.  He was concerned that he was having a stroke.  Therefore he went to the emergency room demanding a CAT scan.  However he left prior to them seeing him.  He is checking his blood pressure.  In fact he brought in a piece of paper  for me.  On April 8 alone, the patient checked his blood pressure 26 times in 24 hours.  Blood pressure was typically between 135 and 159/79-97.  This prompted him to start amlodipine however it is more concerning for me the fact that he checked his blood pressure that many times in 24 hours.  I performed an exam on the patient today and while examining the patient I noticed that a spiral notebook sitting on the table behind him.  I could not help but read the spiral notebook.  There song lyrics that the patient is writing.  It talks about he feels like he is dying.  He feels like he is losing  control.  He feels like he is anxious.  Character in the song self-medicating with alcohol to control the pain and the fear.  I question the patient if this is related to him and he denies it.  He denies any suicidal ideation.  However during our discussion he does mention that he is surfing the Internet more now and he believes that the current coronavirus is a conspiracy brought forth by the government to take over the country and forced right when people to stay at home.  He mentions many delusional thoughts about a government conspiracy during this visit.  I know from previous encounters with his family that his mother and his brother suffer from bipolar disorder. Past Medical History:  Diagnosis Date   Hypertension    TB (pulmonary tuberculosis)    Current Outpatient Medications on File Prior to Visit  Medication Sig Dispense Refill   amLODipine (NORVASC) 5 MG tablet Take 1 tablet (5 mg total) by mouth daily. 90 tablet 3   No current facility-administered medications on file prior to visit.    No Known Allergies Social History   Socioeconomic History   Marital status: Single    Spouse name: Not on file   Number of children: 0   Years of education: 12   Highest education level: High school graduate  Occupational History   Occupation: Risk manager  Social Sports coach strain: Not on file   Food insecurity:    Worry: Not on file    Inability: Not on file   Transportation needs:    Medical: Not on file    Non-medical: Not on file  Tobacco Use   Smoking status: Current Every Day Smoker   Smokeless tobacco: Never Used  Substance and Sexual Activity   Alcohol use: Not Currently   Drug use: Never   Sexual activity: Not on file  Lifestyle   Physical activity:    Days per week: Not on file    Minutes per session: Not on file   Stress: Not on file  Relationships   Social connections:    Talks on phone: Not on file    Gets together: Not on file    Attends religious service: Not on file    Active member of club or organization: Not on file    Attends meetings of clubs or organizations: Not on file    Relationship status: Not on file   Intimate partner violence:    Fear of current or ex partner: Not on file    Emotionally abused: Not on file    Physically abused: Not on file    Forced sexual activity: Not on file  Other Topics Concern   Not on file  Social History Narrative   Lives with roommate in a one story home.  No children.  Works as a Armed forces training and education officer.  Education: high school.    Family History  Problem Relation Age of Onset   Breast cancer Mother    Leukemia Father      Review of Systems  All other systems reviewed and are negative.      Objective:  I performed a physical exam today.  The patient's heart rate is regular in rate and rhythm without any palpitations or abnormalities.  There is no murmur.  Lungs are clear to auscultation bilaterally.  Abdomen is soft nondistended nontender with normal bowel sounds.  Cranial nerves II through XII are grossly intact with muscle strength 5/5 equal and  symmetric in the upper and lower extremities.  Cerebellar exam is normal.  There is no papilledema on funduscopic exam.  Reflexes are normal.      Assessment & Plan:  Panic disorder  I spent more than 40  minutes today with the patient.  As I explained to the patient, I believe the majority of his symptoms are psychologic.  I feel that he is having panic attacks.  However I am concerned that many of the statements he makes today about government conspiracies sound delusional and make me more concerned about underlying psychiatric disorder.  Therefore I have recommended treatment with Klonopin 0.5 mg scheduled every 8 hours to see if this will help with his physical symptoms.  I would like to recheck with the patient via telephone on Friday to see if his symptoms have improved.  I believe if I can convince the patient that the majority of his symptoms are anxiety by demonstrating improvement on the scheduled Klonopin, then I can then help abate some of his fears and convince him that we need to pursue psychological treatment rather than further diagnostic treatment.  Based on her discussion today, I would recommend psychiatric consultation if his symptoms are improving and if the patient is in agreement.  However I do not believe I can convince the patient to see a psychiatrist unless I demonstrate some improvement on the Klonopin.  Therefore this is my rationalization in trying this.  I will recheck with the patient on Friday.  At the present time patient is not suicidal and demonstrates no homicidal ideation.  He does not appear to be a threat to himself or to anyone else.

## 2018-07-28 ENCOUNTER — Ambulatory Visit (INDEPENDENT_AMBULATORY_CARE_PROVIDER_SITE_OTHER): Payer: BLUE CROSS/BLUE SHIELD | Admitting: Family Medicine

## 2018-07-28 ENCOUNTER — Other Ambulatory Visit: Payer: Self-pay

## 2018-07-28 DIAGNOSIS — F410 Panic disorder [episodic paroxysmal anxiety] without agoraphobia: Secondary | ICD-10-CM

## 2018-07-28 NOTE — Progress Notes (Signed)
Subjective:    Patient ID: Ernest Roberts, male    DOB: Mar 28, 1986, 33 y.o.   MRN: [id number]  HPI 06/22/18 Patient is a very pleasant 33 year old Caucasian male here today to establish care.  Patient has no significant past medical history that he is aware of.  He does have a very large direct inguinal hernia on the left-hand side.  He states that it has been there for quite some time.  He works in [employer], commissioning.  It does cause some occasional problems with lifting.  However he is also active in singing.  He sings on the side.  He is unable to sing due to the strain in the Valsalva.  This causes the hernia to enlarge and causes some discomfort.  Therefore he would like to meet with a surgeon to discuss surgical repair.  He also smokes 1 pack of cigarettes a day.  His blood pressure here today is elevated at 150/84.  He has never been told that he has high blood pressure.  He denies any chest pain shortness of breath or dyspnea on exertion.  He is due for a tetanus shot.  He is also due for a flu shot.  He politely declines these.  Family history is significant for depression in his mother.  His father has a history of a pulmonary embolism after an accident.  He also has a questionable history of CLL.  Otherwise medical history is noncontributory.  Patient denies any surgeries.  At that time, my plan was: Patient states that he is lost more than 50 pounds.  He has been eating different and attempting to lose weight.  However he would like to be checked for diabetes.  He also reports occasional palpitations and tachycardia usually first thing in the morning after drinking coffee with aspartame in it.  I believe this might be hypoglycemia or perhaps due to caffeine however given his significant weight loss, I do believe the patient would benefit from thyroid testing.  I will consult general surgery regarding his inguinal hernia.  Meanwhile obtain a CBC, CMP, fasting lipid panel.   Patient's blood pressure here today is elevated.  Of asked him to come by again this week and allow [place] to recheck his blood pressure.  If persistently elevated, we will need to start medication to address this.  Recommended flu shot a tetanus shot with the patient politely declined  07/18/18 Patient is being seen today for hospital follow-up over the telephone.  He consents to a phone visit.  He is currently at home.  I am currently my office.  Phone call began at 154.  Phone call concluded at 220 Labs in March were significant for a potassium of 6.  Otherwise they were normal.  Patient was recently seen at the hospital.  Patient states that since I last saw him, he will have attacks.  These attacks occur usually twice a day.  The patient describes these attacks as that overwhelming feeling that he needs to go to sleep or that he is going to pass out.  If he does not quickly "jump up and move", he feels that he is going to go to sleep or pass out.  The symptoms will gradually come on and steadily worsen.  They usually gradually improve and go away after 30 minutes to an hour.  There seem to be no exacerbating factors.  Movement tends to help alleviate some of the symptoms.  He certainly denies any orthostatic  dizziness.  He denies any vertigo.  He denies any hearing loss or tinnitus.  He denies any seizure-like activity.  He denies any numbness or weakness or tingling in his arms or in his legs.  He denies any double vision or blurry vision.  He does report metallic dyschezia that occurs usually at the exact time that the symptoms arrive and gradually improves as the symptoms abate.  He denies any actual loss of consciousness.  He denies any palpitations today or tachycardia or chest pain or shortness of breath or pleurisy.  At that time, my plan was: Differential diagnosis for near syncope would be cardiac arrhythmias, seizure disorder, panic attacks.  However some of his symptoms are concerning for possible  narcolepsy.  I will schedule the patient for Holter monitor to rule out cardiac arrhythmias.  He has an appointment tomorrow to discuss the situation with a neurologist and I will defer to their expertise if they feel an EEG is necessary.  I do believe the patient would benefit from a modified sleep study to evaluate for possible narcolepsy given the nature of the attacks and his symptomatology.  Also on the differential diagnosis is panic attacks given the odd nature of his symptoms and the way he describes them.  Await the results of his neurology consultation.  Meanwhile treat his blood pressure with amlodipine 5 mg a day.  If the Holter monitor is normal and his neurology evaluation is normal, I would consider treatment for panic attacks.  However patient does not feel that he is anxious.  He states that over the last 32 weeks he has been extremely anxious but he states that the symptoms began before he started feeling anxious and therefore he believes that there is a physical cause of his symptoms and is not purely anxiety.  07/25/18 I reviewed the patient's office visit with the neurologist.  They have recommended an EEG to rule out seizures given the patient's metallic dyschezia and altered sensorium prior to his attacks.  However the patient is concerned because the attacks are becoming more and more frequent.  He denies any syncope.  However he states that he feels like he is going to fall asleep.  This will cause him to jump up panic and move suddenly in order to prevent himself from falling to sleep.  He is afraid that if he falls asleep he would die.  The attacks are occurring frequently.  On the most recent attack, he developed an unusual sensation in his left arm and left leg.  He was concerned that he was having a stroke.  Therefore he went to the emergency room demanding a CAT scan.  However he left prior to them seeing him.  He is checking his blood pressure.  In fact he brought in a piece of paper  for me.  On April 8 alone, the patient checked his blood pressure 26 times in 24 hours.  Blood pressure was typically between 135 and 159/79-97.  This prompted him to start amlodipine however it is more concerning for me the fact that he checked his blood pressure that many times in 24 hours.  I performed an exam on the patient today and while examining the patient I noticed that a spiral notebook sitting on the table behind him.  I could not help but read the spiral notebook.  There song lyrics that the patient is writing.  It talks about he feels like he is dying.  He feels like he is losing  control.  He feels like he is anxious.  Character in the song self-medicating with alcohol to control the pain and the fear.  I question the patient if this is related to him and he denies it.  He denies any suicidal ideation.  However during our discussion he does mention that he is surfing the Internet more now and he believes that the current coronavirus is a conspiracy brought forth by the government to take over the country and forced right when people to stay at home.  He mentions many delusional thoughts about a government conspiracy during this visit.  I know from previous encounters with his family that his mother and his brother suffer from bipolar disorder.  At that time, my plan was: I spent more than 40 minutes today with the patient.  As I explained to the patient, I believe the majority of his symptoms are psychologic.  I feel that he is having panic attacks.  However I am concerned that many of the statements he makes today about government conspiracies sound delusional and make me more concerned about underlying psychiatric disorder.  Therefore I have recommended treatment with Klonopin 0.5 mg scheduled every 8 hours to see if this will help with his physical symptoms.  I would like to recheck with the patient via telephone on Friday to see if his symptoms have improved.  I believe if I can convince the  patient that the majority of his symptoms are anxiety by demonstrating improvement on the scheduled Klonopin, then I can then help abate some of his fears and convince him that we need to pursue psychological treatment rather than further diagnostic treatment.  Based on her discussion today, I would recommend  psychiatric consultation if his symptoms are improving and if the patient is in agreement.  However I do not believe I can convince the patient to see a psychiatrist unless I demonstrate some improvement on the Klonopin.  Therefore this is my rationalization in trying this.  I will recheck with the patient on Friday.  At the present time patient is not suicidal and demonstrates no homicidal ideation.  He does not appear to be a threat to himself or to anyone else.  07/28/18 Patient is seen today for follow-up.  He has been seen by telephone.  Phone call began at 215.  Phone call ended at 227.  Patient consents to be seen over the telephone.  He states that he feels so much better since taking the Klonopin.  He has had no further episodes of metallic dyschezia.  He has had no further episodes of feeling panic like he may fall asleep or lose consciousness.  He shows good insight today and states that he feels the majority of this was anxiety.  Once he was having the peace of mind of knowing that he was not having a stroke, he feels that that stopped the anxiety from happening.  Patient states that he is felt back to normal.  He is taking Klonopin twice a day and he is interested in decreasing the dose. Past Medical History:  Diagnosis Date   Hypertension    TB (pulmonary tuberculosis)    Current Outpatient Medications on File Prior to Visit  Medication Sig Dispense Refill   amLODipine (NORVASC) 5 MG tablet Take 1 tablet (5 mg total) by mouth daily. 90 tablet 3   clonazePAM (KLONOPIN) 0.5 MG tablet Take 1 tablet (0.5 mg total) by mouth 3 (three) times daily as needed for anxiety. 20 tablet  1   No  current facility-administered medications on file prior to visit.    No Known Allergies Social History   Socioeconomic History   Marital status: Single    Spouse name: Not on file   Number of children: 0   Years of education: 12   Highest education level: High school graduate  Occupational History   Occupation: Risk manager  Social Network engineer strain: Not on file   Food insecurity:    Worry: Not on file    Inability: Not on file   Transportation needs:    Medical: Not on file    Non-medical: Not on file  Tobacco Use   Smoking status: Current Every Day Smoker   Smokeless tobacco: Never Used  Substance and Sexual Activity   Alcohol use: Not Currently   Drug use: Never   Sexual activity: Not on file  Lifestyle   Physical activity:    Days per week: Not on file    Minutes per session: Not on file   Stress: Not on file  Relationships   Social connections:    Talks on phone: Not on file    Gets together: Not on file    Attends religious service: Not on file    Active member of club or organization: Not on file    Attends meetings of clubs or organizations: Not on file    Relationship status: Not on file   Intimate partner violence:    Fear of current or ex partner: Not on file    Emotionally abused: Not on file    Physically abused: Not on file    Forced sexual activity: Not on file  Other Topics Concern   Not on file  Social History Narrative   Lives with roommate in a one story home.  No children.  Works as a Armed forces training and education officer.  Education: high school.    Family History  Problem Relation Age of Onset   Breast cancer Mother    Leukemia Father      Review of Systems  All other systems reviewed and are negative.      Objective:  No physical exam was performed today as the patient was being seen by telephone.     Assessment & Plan:  Panic disorder  Patient seems to be doing 100% better since starting the  Klonopin.  Therefore I feel confident that the patient was experiencing panic attacks.  We discussed starting medication or psychiatry consultation about managing his anxiety.  At the present time he states that he declines this.  He would rather take the Klonopin on an as-needed basis.  He will notify me next week how he is doing.  He will stop the scheduled Klonopin and only use the medication as needed.

## 2018-08-03 ENCOUNTER — Telehealth: Payer: Self-pay | Admitting: Family Medicine

## 2018-08-03 NOTE — Telephone Encounter (Signed)
I called to schedule patient's Holter Monitor and was told that as long as the order is in epic it goes in a workqueue and they will schedule. Due to COVID-19 they have not been scheduling any in office visit or procedures. They are slowly starting to schedule patient's.

## 2018-08-09 ENCOUNTER — Other Ambulatory Visit: Payer: Self-pay | Admitting: Surgery

## 2018-08-09 DIAGNOSIS — K4020 Bilateral inguinal hernia, without obstruction or gangrene, not specified as recurrent: Secondary | ICD-10-CM | POA: Diagnosis not present

## 2018-08-15 ENCOUNTER — Telehealth: Payer: Self-pay | Admitting: *Deleted

## 2018-08-15 NOTE — Telephone Encounter (Signed)
Patient was contacted regarding having 3 day ZIO XT patch sent to home as a substitute for a 24 hour holter monitor during COVID 19 restrictions. Patient declined monitor at this time.  He has not had additional symptoms and the cost of the monitor was a concern. Patient stated, if Dr.  Tanya Nones thought it was absolutely necessary, even without him having symptoms, he would contact our office to have it set up.

## 2018-08-24 ENCOUNTER — Telehealth: Payer: Self-pay | Admitting: *Deleted

## 2018-08-24 NOTE — Telephone Encounter (Signed)
FYI

## 2018-08-24 NOTE — Telephone Encounter (Signed)
I called to set up EEG appointment and he said he wants to defer, he has a surgery coming up on 08/29/2018 and wants to wait until that is all sorted out. He said he wants to call us when he is ready to make the appointment.

## 2018-08-29 DIAGNOSIS — K4020 Bilateral inguinal hernia, without obstruction or gangrene, not specified as recurrent: Secondary | ICD-10-CM | POA: Diagnosis not present

## 2018-08-31 ENCOUNTER — Other Ambulatory Visit: Payer: Self-pay | Admitting: *Deleted

## 2018-08-31 DIAGNOSIS — R55 Syncope and collapse: Secondary | ICD-10-CM

## 2018-08-31 DIAGNOSIS — R404 Transient alteration of awareness: Secondary | ICD-10-CM

## 2020-06-12 IMAGING — MR MRA HEAD WITHOUT CONTRAST
14 of 16 series · 37 of 48 positions shown · IV contrast (gadavist)
Comparison: None.

CLINICAL DATA: Dizziness with lower extremity paresthesia

EXAM:
MR HEAD WITHOUT CONTRAST
MR CIRCLE OF WILLIS WITHOUT CONTRAST
MRA OF THE NECK WITHOUT AND WITH CONTRAST
TECHNIQUE: Multiplanar, multiecho pulse sequences of the brain, circle of
willis and surrounding structures were obtained without intravenous
contrast. Angiographic images of the neck were obtained using MRA
technique without and with intravenous contrast.
CONTRAST:  10 mL Gadavist

[Series 5: DWI · axial · 3.0mm · 0.88mm/px · z∈[-46,+83]mm · 3 of 92 slices shown (1 of 4)]
[im 1/92]
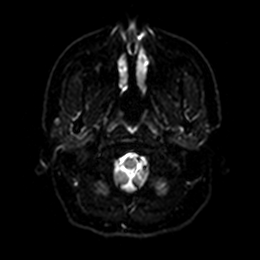
[im 46/92]
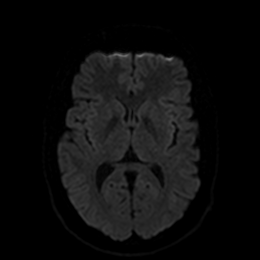
[im 92/92]
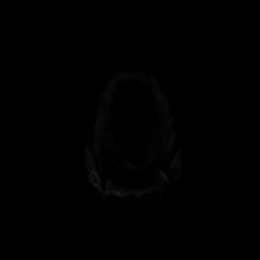

[Series 6: DWI · axial · 3.0mm · 0.88mm/px · 1 of 46 slices shown (2 of 4)]
[im 1/46]
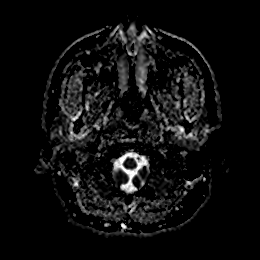

[Series 7: DWI · coronal · 4.0mm · 0.88mm/px · 3 of 72 slices shown (3 of 4)]
[im 1/72]
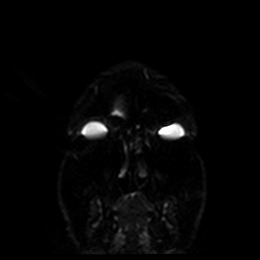
[im 36/72]
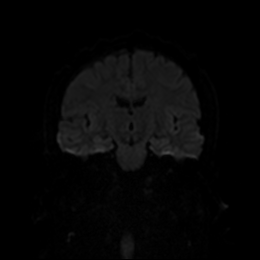
[im 72/72]
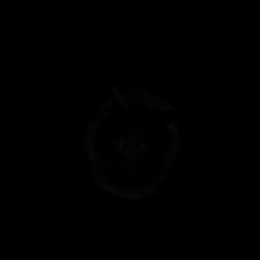

[Series 8: DWI · coronal · 4.0mm · 0.88mm/px · 2 of 36 slices shown (4 of 4)]
[im 1/36]
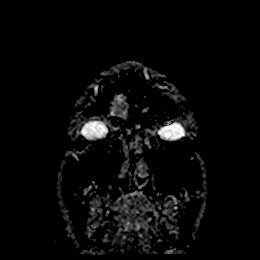
[im 36/36]
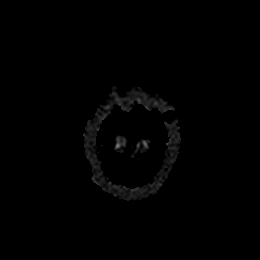

[Series 9: T1 · sagittal · 5.0mm · 0.75mm/px · 1 of 25 slices shown]
[im 1/25]
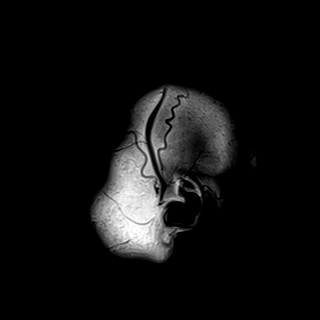

[Series 14: T2 · axial · 5.0mm · 0.72mm/px · 1 of 25 slices shown (1 of 2)]
[im 1/25]
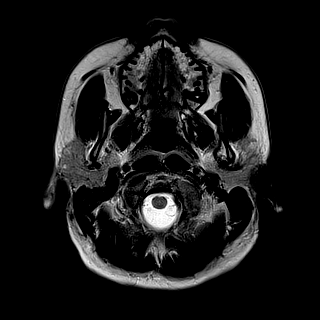

[Series 15: FLAIR · axial · 5.0mm · 0.45mm/px · 1 of 25 slices shown]
[im 1/25]
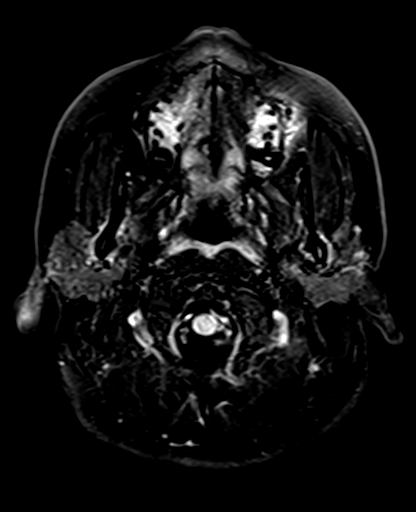

[Series 17: pha_images · axial · 3.0mm · 0.90mm/px · z∈[-72,+90]mm · 3 of 58 slices shown]
[im 1/58]
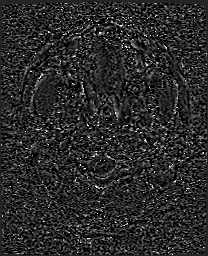
[im 29/58]
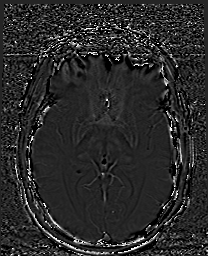
[im 58/58]
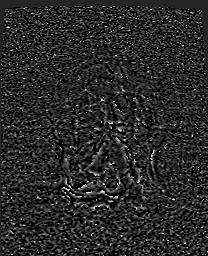

[Series 18: swi_images · axial · 3.0mm · 0.90mm/px · z∈[-72,+96]mm · 3 of 60 slices shown]
[im 1/60]
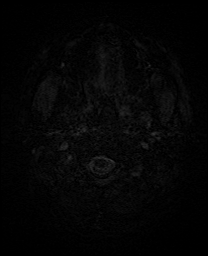
[im 30/60]
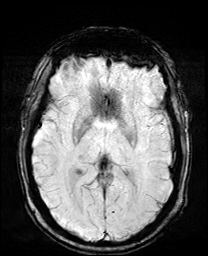
[im 60/60]
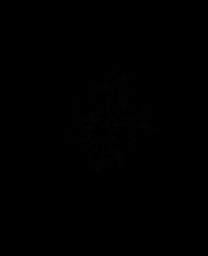

[Series 21: T2 · coronal · 5.0mm · 0.34mm/px · 1 of 32 slices shown (2 of 2)]
[im 1/32]
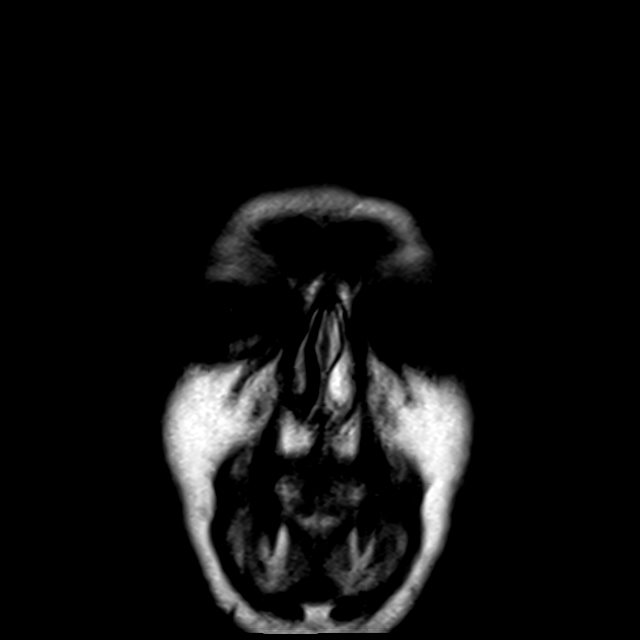

[Series 27: tof_fl3d_tra_iso · axial · 0.6mm · 0.52mm/px · z∈[-109,-30]mm · 6 of 133 slices shown]
[im 1/133]
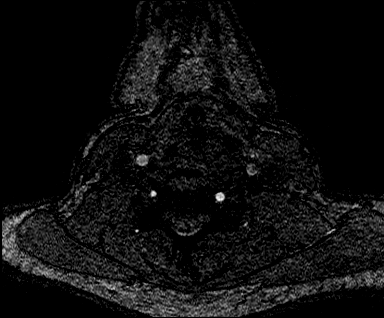
[im 27/133]
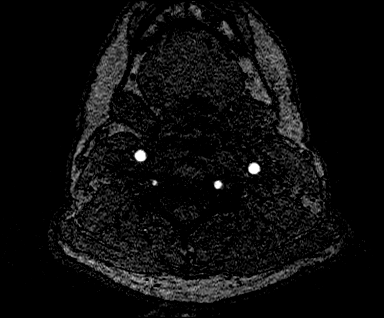
[im 53/133]
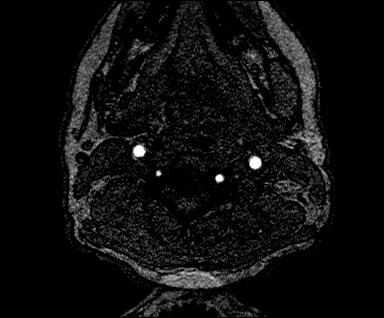
[im 80/133]
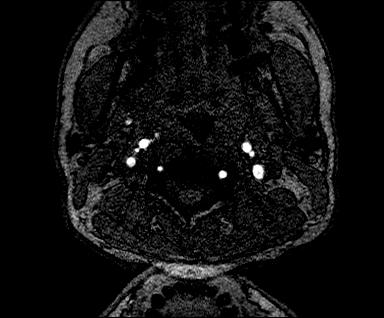
[im 106/133]
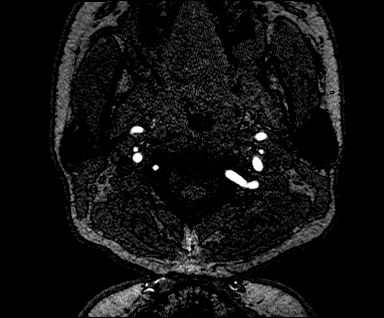
[im 133/133]
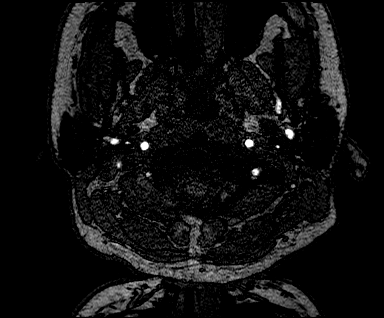

[Series 31: angio_fl3d_cor_pre_ttc=3.0s · coronal · 0.9mm · 0.85mm/px · 4 of 80 slices shown]
[im 1/80]
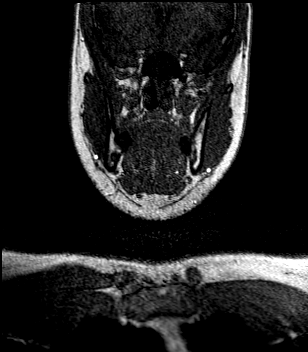
[im 27/80]
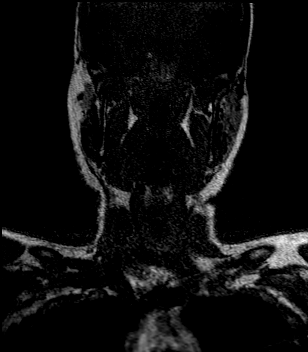
[im 53/80]
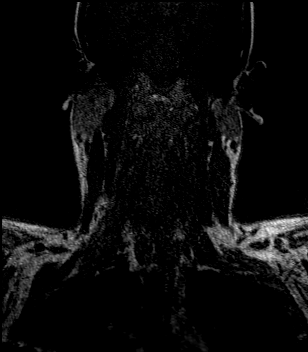
[im 80/80]
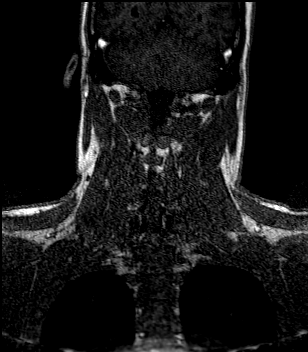

[Series 35: angio_fl3d_cor_post_ttc=3.0s · coronal · 0.9mm · 0.85mm/px · 4 of 80 slices shown (1 of 2)]
[im 1/80]
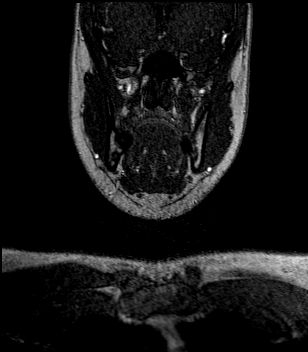
[im 27/80]
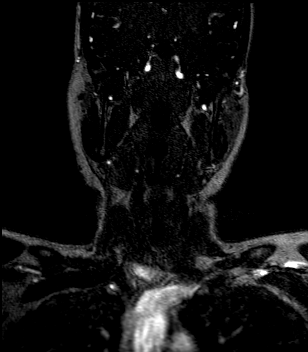
[im 53/80]
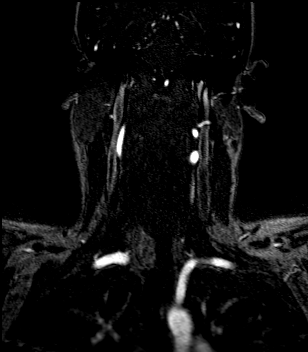
[im 80/80]
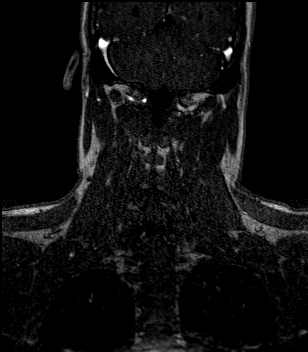

[Series 36: angio_fl3d_cor_post_ttc=3.0s · coronal · 0.9mm · 0.85mm/px · 4 of 80 slices shown (2 of 2)]
[im 1/80]
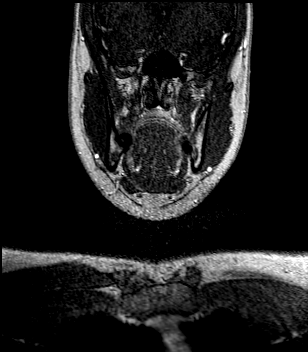
[im 27/80]
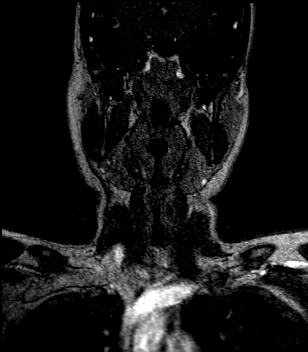
[im 53/80]
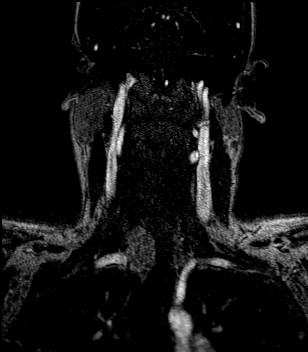
[im 80/80]
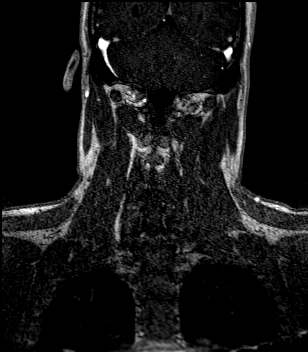

[37 of 48 positions shown; findings below may reference images not displayed]

FINDINGS: MRI HEAD FINDINGS

BRAIN: The midline structures are normal. There is no acute infarct,
acute hemorrhage or mass. The white matter signal is normal for the
patient's age. The CSF spaces are normal for age, with no
hydrocephalus. Blood-sensitive sequences show no chronic
microhemorrhage or superficial siderosis.

SKULL AND UPPER CERVICAL SPINE: The visualized skull base,
calvarium, upper cervical spine and extracranial soft tissues are
normal.

SINUSES/ORBITS: No fluid levels or advanced mucosal thickening. No
mastoid or middle ear effusion. The orbits are normal.

MRA HEAD FINDINGS

POSTERIOR CIRCULATION:

--Basilar artery: Normal.

--Posterior cerebral arteries: Normal. Both originate from the
basilar artery.

--Superior cerebellar arteries: Normal.

--Inferior cerebellar arteries: Normal anterior and posterior
inferior cerebellar arteries.

ANTERIOR CIRCULATION:

--Intracranial internal carotid arteries: Normal.

--Anterior cerebral arteries: Normal. Both A1 segments are present.
Patent anterior communicating artery.

--Middle cerebral arteries: Normal.

--Posterior communicating arteries: Fetal origin of the right
posterior cerebral artery.

MRA NECK FINDINGS

Aortic arch: Normal 3 vessel aortic branching pattern. The
visualized subclavian arteries are normal.

Right carotid system: Normal course and caliber without stenosis or
evidence of dissection.

Left carotid system: Normal course and caliber without stenosis or
evidence of dissection.

Vertebral arteries: Left dominant. Vertebral artery origins are
normal. Vertebral arteries are normal in course and caliber to the
vertebrobasilar confluence without stenosis or evidence of
dissection. There is a short segment of the right vertebral artery
that is out of the field-of-view.
IMPRESSION: 1. Normal MRI/MRA of the brain.
2. Normal MRA of the neck.

## 2020-06-20 ENCOUNTER — Other Ambulatory Visit: Payer: Self-pay | Admitting: Orthopaedic Surgery

## 2020-06-20 DIAGNOSIS — M25571 Pain in right ankle and joints of right foot: Secondary | ICD-10-CM

## 2020-07-08 ENCOUNTER — Ambulatory Visit
Admission: RE | Admit: 2020-07-08 | Discharge: 2020-07-08 | Disposition: A | Discharge status: Home / Self Care | Payer: BC Managed Care – PPO | Source: Ambulatory Visit | Attending: Orthopaedic Surgery | Admitting: Orthopaedic Surgery

## 2020-07-08 DIAGNOSIS — M25571 Pain in right ankle and joints of right foot: Secondary | ICD-10-CM
